# Patient Record
Sex: Male | Born: 1965 | Hispanic: Yes | Marital: Married
Health system: Southern US, Community
[De-identification: ages and names within clinical notes are randomized; demographics above are authoritative.]

## PROBLEM LIST (undated history)

## (undated) HISTORY — PX: CRANIOTOMY: SHX93

---

## 2014-07-22 ENCOUNTER — Emergency Department (HOSPITAL_COMMUNITY)
Admission: EM | Admit: 2014-07-22 | Discharge: 2014-07-22 | Disposition: A | Discharge status: Home / Self Care | Payer: Worker's Compensation | Attending: Emergency Medicine | Admitting: Emergency Medicine

## 2014-07-22 ENCOUNTER — Encounter (HOSPITAL_COMMUNITY): Payer: Self-pay | Admitting: *Deleted

## 2014-07-22 ENCOUNTER — Emergency Department (HOSPITAL_COMMUNITY): Payer: Worker's Compensation

## 2014-07-22 DIAGNOSIS — S3992XA Unspecified injury of lower back, initial encounter: Secondary | ICD-10-CM

## 2014-07-22 DIAGNOSIS — Y92410 Unspecified street and highway as the place of occurrence of the external cause: Secondary | ICD-10-CM | POA: Insufficient documentation

## 2014-07-22 DIAGNOSIS — S9001XA Contusion of right ankle, initial encounter: Secondary | ICD-10-CM | POA: Insufficient documentation

## 2014-07-22 DIAGNOSIS — X58XXXA Exposure to other specified factors, initial encounter: Secondary | ICD-10-CM | POA: Insufficient documentation

## 2014-07-22 DIAGNOSIS — Y9389 Activity, other specified: Secondary | ICD-10-CM | POA: Insufficient documentation

## 2014-07-22 DIAGNOSIS — Y998 Other external cause status: Secondary | ICD-10-CM | POA: Insufficient documentation

## 2014-07-22 DIAGNOSIS — M25571 Pain in right ankle and joints of right foot: Secondary | ICD-10-CM

## 2014-07-22 MED ORDER — CYCLOBENZAPRINE HCL 10 MG PO TABS: 5.0000 mg | ORAL_TABLET | Freq: Two times a day (BID) | ORAL | Status: AC | PRN
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
## 2014-07-22 NOTE — Discharge Instructions (Signed)
Arthralgia °Your caregiver has diagnosed you as suffering from an arthralgia. Arthralgia means there is pain in a joint. This can come from many reasons including: °· Bruising the joint which causes soreness (inflammation) in the joint. °· Wear and tear on the joints which occur as we grow older (osteoarthritis). °· Overusing the joint. °· Various forms of arthritis. °· Infections of the joint. °Regardless of the cause of pain in your joint, most of these different pains respond to anti-inflammatory drugs and rest. The exception to this is when a joint is infected, and these cases are treated with antibiotics, if it is a bacterial infection. °HOME CARE INSTRUCTIONS  °· Rest the injured area for as long as directed by your caregiver. Then slowly start using the joint as directed by your caregiver and as the pain allows. Crutches as directed may be useful if the ankles, knees or hips are involved. If the knee was splinted or casted, continue use and care as directed. If an stretchy or elastic wrapping bandage has been applied today, it should be removed and re-applied every 3 to 4 hours. It should not be applied tightly, but firmly enough to keep swelling down. Watch toes and feet for swelling, bluish discoloration, coldness, numbness or excessive pain. If any of these problems (symptoms) occur, remove the ace bandage and re-apply more loosely. If these symptoms persist, contact your caregiver or return to this location. °· For the first 24 hours, keep the injured extremity elevated on pillows while lying down. °· Apply ice for 15-20 minutes to the sore joint every couple hours while awake for the first half day. Then 03-04 times per day for the first 48 hours. Put the ice in a plastic bag and place a towel between the bag of ice and your skin. °· Wear any splinting, casting, elastic bandage applications, or slings as instructed. °· Only take over-the-counter or prescription medicines for pain, discomfort, or fever as  directed by your caregiver. Do not use aspirin immediately after the injury unless instructed by your physician. Aspirin can cause increased bleeding and bruising of the tissues. °· If you were given crutches, continue to use them as instructed and do not resume weight bearing on the sore joint until instructed. °Persistent pain and inability to use the sore joint as directed for more than 2 to 3 days are warning signs indicating that you should see a caregiver for a follow-up visit as soon as possible. Initially, a hairline fracture (break in bone) may not be evident on X-rays. Persistent pain and swelling indicate that further evaluation, non-weight bearing or use of the joint (use of crutches or slings as instructed), or further X-rays are indicated. X-rays may sometimes not show a small fracture until a week or 10 days later. Make a follow-up appointment with your own caregiver or one to whom we have referred you. A radiologist (specialist in reading X-rays) may read your X-rays. Make sure you know how you are to obtain your X-ray results. Do not assume everything is normal if you do not hear from us. °SEEK MEDICAL CARE IF: °Bruising, swelling, or pain increases. °SEEK IMMEDIATE MEDICAL CARE IF:  °· Your fingers or toes are numb or blue. °· The pain is not responding to medications and continues to stay the same or get worse. °· The pain in your joint becomes severe. °· You develop a fever over 102° F (38.9° C). °· It becomes impossible to move or use the joint. °MAKE SURE YOU:  °·   Understand these instructions. °· Will watch your condition. °· Will get help right away if you are not doing well or get worse. °Document Released: 01/26/2005 Document Revised: 04/20/2011 Document Reviewed: 09/14/2007 °[employer]® Patient Information ©2015 [employer]. This information is not intended to replace advice given to you by your health care provider. Make sure you discuss any questions you have with your health care  provider. °Back Pain, Adult °Low back pain is very common. About 1 in 5 people have back pain. The cause of low back pain is rarely dangerous. The pain often gets better over time. About half of people with a sudden onset of back pain feel better in just 2 weeks. About 8 in 10 people feel better by 6 weeks.  °CAUSES °Some common causes of back pain include: °· Strain of the muscles or ligaments supporting the spine. °· Wear and tear (degeneration) of the spinal discs. °· Arthritis. °· Direct injury to the back. °DIAGNOSIS °Most of the time, the direct cause of low back pain is not known. However, back pain can be treated effectively even when the exact cause of the pain is unknown. Answering your caregiver's questions about your overall health and symptoms is one of the most accurate ways to make sure the cause of your pain is not dangerous. If your caregiver needs more information, he or she may order lab work or imaging tests (X-rays or MRIs). However, even if imaging tests show changes in your back, this usually does not require surgery. °HOME CARE INSTRUCTIONS °For many people, back pain returns. Since low back pain is rarely dangerous, it is often a condition that people can learn to manage on their own.  °· Remain active. It is stressful on the back to sit or stand in one place. Do not sit, drive, or stand in one place for more than 30 minutes at a time. Take short walks on level surfaces as soon as pain allows. Try to increase the length of time you walk each day. °· Do not stay in bed. Resting more than 1 or 2 days can delay your recovery. °· Do not avoid exercise or work. Your body is made to move. It is not dangerous to be active, even though your back may hurt. Your back will likely heal faster if you return to being active before your pain is gone. °· Pay attention to your body when you  bend and lift. Many people have less discomfort when lifting if they bend their knees, keep the load close to their  bodies, and avoid twisting. Often, the most comfortable positions are those that put less stress on your recovering back. °· Find a comfortable position to sleep. Use a firm mattress and lie on your side with your knees slightly bent. If you lie on your back, put a pillow under your knees. °· Only take over-the-counter or prescription medicines as directed by your caregiver. Over-the-counter medicines to reduce pain and inflammation are often the most helpful. Your caregiver may prescribe muscle relaxant drugs. These medicines help dull your pain so you can more quickly return to your normal activities and healthy exercise. °· Put ice on the injured area. °¨ Put ice in a plastic bag. °¨ Place a towel between your skin and the bag. °¨ Leave the ice on for 15-20 minutes, 03-04 times a day for the first 2 to 3 days. After that, ice and heat may be alternated to reduce pain and spasms. °· Ask your caregiver about trying back exercises and gentle massage. This may be of some benefit. °· Avoid feeling anxious or stressed. Stress   increases muscle tension and can worsen back pain.It is important to recognize when you are anxious or stressed and learn ways to manage it.Exercise is a great option. SEEK MEDICAL CARE IF:  You have pain that is not relieved with rest or medicine.  You have pain that does not improve in 1 week.  You have new symptoms.  You are generally not feeling well. SEEK IMMEDIATE MEDICAL CARE IF:   You have pain that radiates from your back into your legs.  You develop new bowel or bladder control problems.  You have unusual weakness or numbness in your arms or legs.  You develop nausea or vomiting.  You develop abdominal pain.  You feel faint. Document Released: 01/26/2005 Document Revised: 07/28/2011 Document Reviewed: 05/30/2013 [facility] Patient Information 2015 [place], [place]. This information is not intended to replace advice given to you by your health care provider. Make  sure you discuss any questions you have with your health care provider.
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
## 2014-07-22 NOTE — ED Notes (Signed)
Declined W/C at D/C and was escorted to lobby by RN. 

## 2016-02-26 IMAGING — DX DG ANKLE COMPLETE 3+V*R*
3 series · 3 of 3 positions shown · non-contrast
Comparison: None.

CLINICAL DATA: Jumping injury with right ankle pain, initial
encounter

EXAM:
RIGHT ANKLE - COMPLETE 3+ VIEW

[ankle ap]
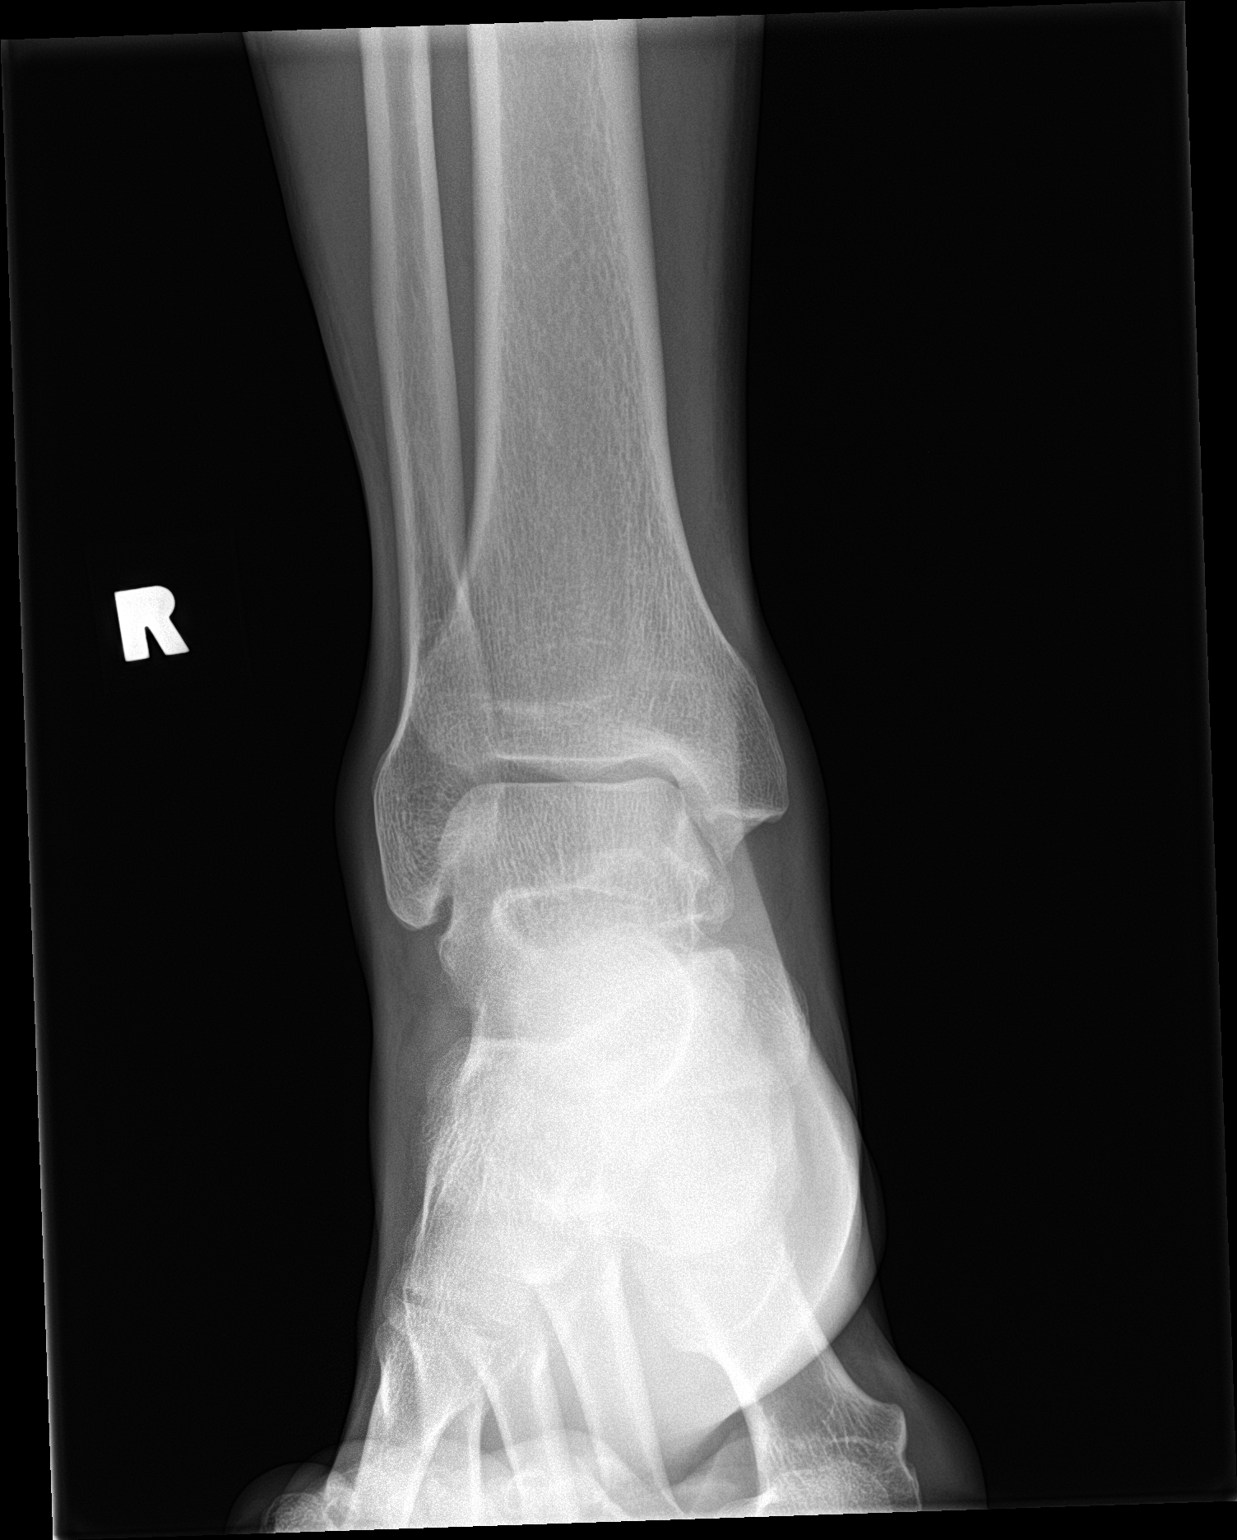

[ankle obl]
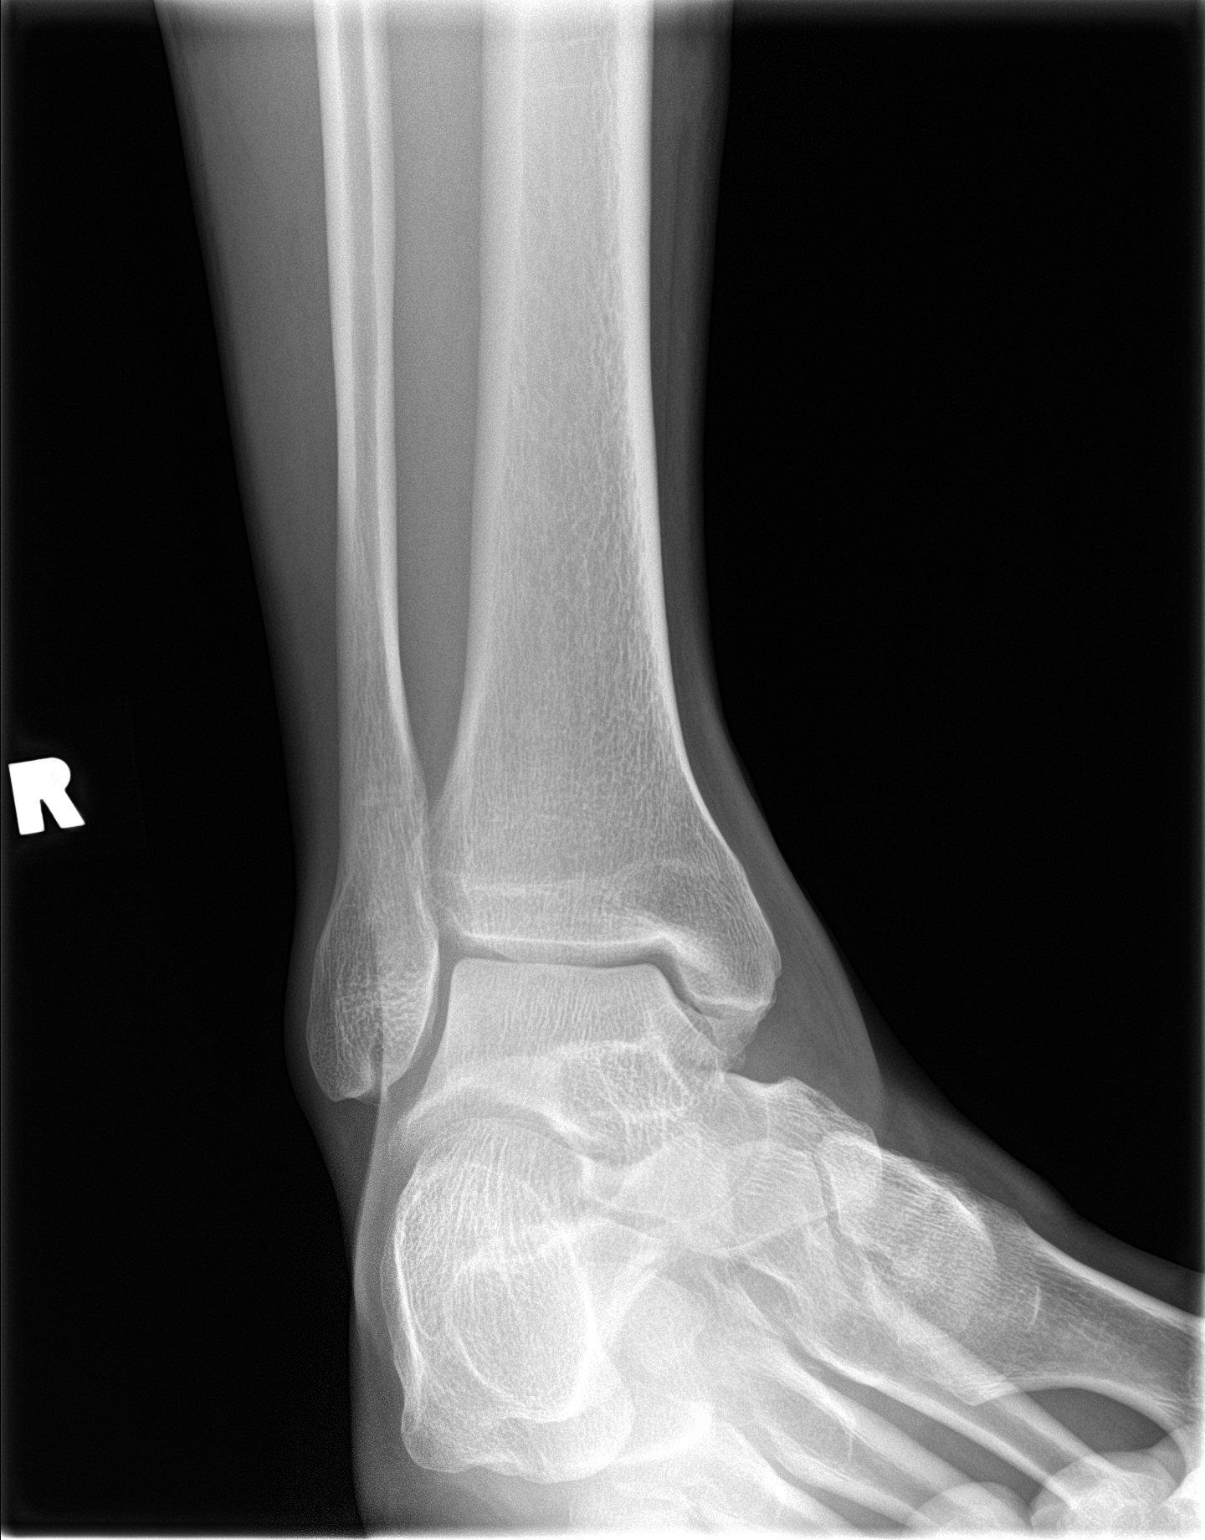

[ankle lat]
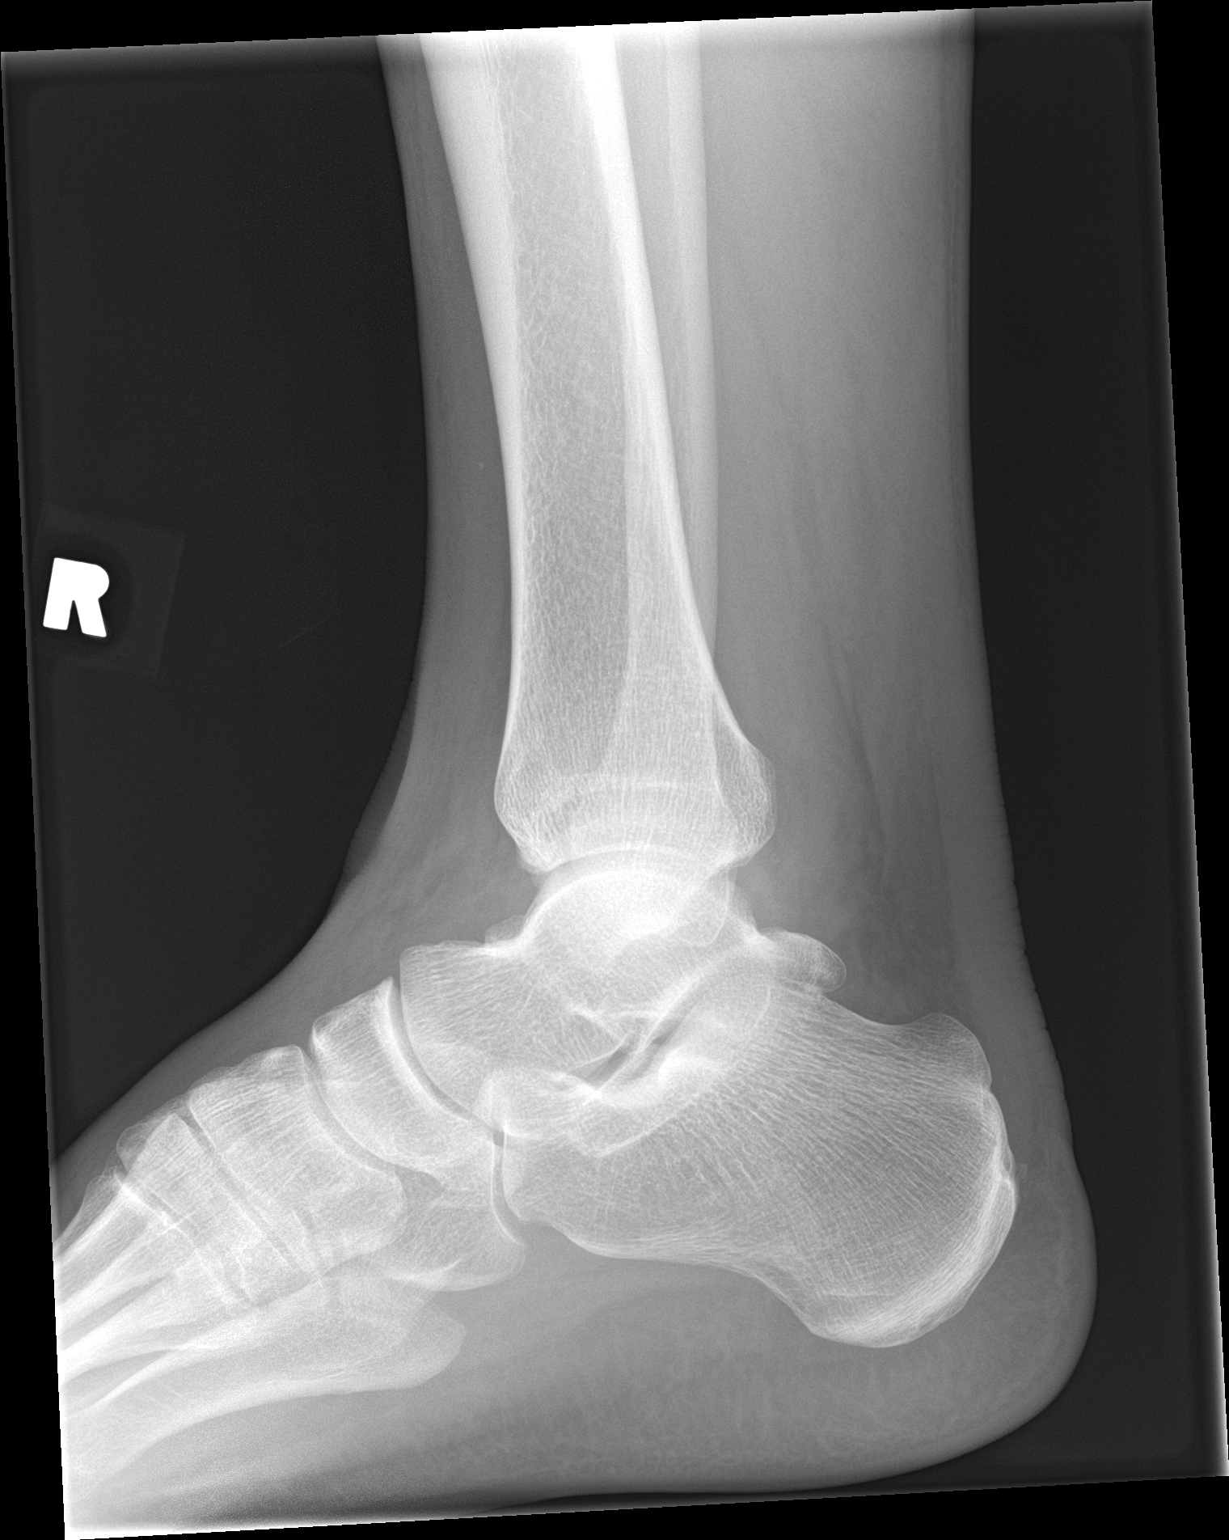

[3 of 3 positions shown; findings below may reference images not displayed]

FINDINGS: There is no evidence of fracture, dislocation, or joint effusion.
There is no evidence of arthropathy or other focal bone abnormality.
Soft tissues are unremarkable.
IMPRESSION: No acute abnormality noted.
